# Patient Record
Sex: Female | Born: 1990 | Race: White | Hispanic: No | Marital: Married | Smoking: Never smoker
Health system: Southern US, Community
[De-identification: ages and names within clinical notes are randomized; demographics above are authoritative.]

---

## 2017-03-21 ENCOUNTER — Ambulatory Visit: Payer: Self-pay | Admitting: Obstetrics & Gynecology

## 2017-04-24 ENCOUNTER — Ambulatory Visit: Payer: Self-pay | Admitting: Obstetrics & Gynecology

## 2019-05-06 ENCOUNTER — Other Ambulatory Visit: Payer: Self-pay

## 2019-05-06 ENCOUNTER — Ambulatory Visit (HOSPITAL_COMMUNITY)
Admission: EM | Admit: 2019-05-06 | Discharge: 2019-05-06 | Disposition: A | Discharge status: Home / Self Care | Payer: BLUE CROSS/BLUE SHIELD | Attending: Family Medicine | Admitting: Family Medicine

## 2019-05-06 ENCOUNTER — Encounter (HOSPITAL_COMMUNITY): Payer: Self-pay | Admitting: Emergency Medicine

## 2019-05-06 DIAGNOSIS — S40861A Insect bite (nonvenomous) of right upper arm, initial encounter: Secondary | ICD-10-CM

## 2019-05-06 DIAGNOSIS — W57XXXA Bitten or stung by nonvenomous insect and other nonvenomous arthropods, initial encounter: Secondary | ICD-10-CM

## 2019-05-06 MED ORDER — DOXYCYCLINE HYCLATE 100 MG PO CAPS: 100.0000 mg | ORAL_CAPSULE | Freq: Two times a day (BID) | ORAL | 0 refills | Status: AC

## 2019-05-06 MED ORDER — HYDROCORTISONE 1 % EX CREA: TOPICAL_CREAM | CUTANEOUS | 0 refills | Status: AC

## 2019-05-06 NOTE — Discharge Instructions (Signed)
Try the hydrocortisone cream on the bites for the next couple days.  If the redness or swelling worsens or you develop fevers, chills or joint aches go ahead and start the antibiotics. For severely worsening symptoms you need to follow-up in person or go to the ER

## 2019-05-06 NOTE — ED Triage Notes (Signed)
Pt here for insect bites to arms that are red and painful

## 2019-05-06 NOTE — ED Provider Notes (Signed)
[facility]    CSN: [id number] Arrival date & time: 05/06/19  1053      History   Chief Complaint Chief Complaint  Patient presents with  . Insect Bite    HPI Stephanie Mueller is a 28 y.o. female.   Pt is a 28 year old female that presents with insect bites to right upper arm.  This is been present for the past couple days.  There is 2 total bites.  One bite appears to be healing and does not cause any discomfort.  The second bite is painful, more red with knot.  The bites have been pruritic.  She has not done anything to treat the insect bites.  Denies any associated fever, chills, joint aches, nausea or vomiting.  Unsure of insect.  ROS per HPI      History reviewed. No pertinent past medical history.  There are no active problems to display for this patient.   History reviewed. No pertinent surgical history.  OB History   No obstetric history on file.      Home Medications    Prior to Admission medications   Medication Sig Start Date End Date Taking? Authorizing Provider  doxycycline (VIBRAMYCIN) 100 MG capsule Take 1 capsule (100 mg total) by mouth 2 (two) times daily. 05/06/19   Loura Halt A, NP  hydrocortisone cream 1 % Apply to affected area 2 times daily 05/06/19   Orvan July, NP    Family History History reviewed. No pertinent family history.  Social History Social History   Tobacco Use  . Smoking status: Never Smoker  . Smokeless tobacco: Never Used  Substance Use Topics  . Alcohol use: Not Currently  . Drug use: Never     Allergies   Patient has no known allergies.   Review of Systems Review of Systems   Physical Exam Triage Vital Signs ED Triage Vitals  Enc Vitals Group     BP 05/06/19 1159 140/90     Pulse Rate 05/06/19 1159 93     Resp 05/06/19 1159 18     Temp 05/06/19 1159 98.1 F (36.7 C)     Temp Source 05/06/19 1159 Oral     SpO2 05/06/19 1159 100 %     Weight --      Height --      Head Circumference  --      Peak Flow --      Pain Score 05/06/19 1200 2     Pain Loc --      Pain Edu? --      Excl. in [place]? --    No data found.  Updated Vital Signs BP 140/90 (BP Location: Right Arm)   Pulse 93   Temp 98.1 F (36.7 C) (Oral)   Resp 18   SpO2 100%   Visual Acuity Right Eye Distance:   Left Eye Distance:   Bilateral Distance:    Right Eye Near:   Left Eye Near:    Bilateral Near:     Physical Exam Vitals signs and nursing note reviewed.  Constitutional:      General: She is not in acute distress.    Appearance: Normal appearance. She is not ill-appearing, toxic-appearing or diaphoretic.  HENT:     Head: Normocephalic and atraumatic.     Nose: Nose normal.     Mouth/Throat:     Pharynx: Oropharynx is clear.  Eyes:     Conjunctiva/sclera: Conjunctivae normal.  Neck:  Musculoskeletal: Normal range of motion.  Pulmonary:     Effort: Pulmonary effort is normal.  Musculoskeletal: Normal range of motion.  Skin:    General: Skin is warm and dry.     Comments: 2 small red, raised areas to right upper arm. 1 healing with scab and the other red, warm and slightly hardened. No streaking. Mildly tender.   Neurological:     Mental Status: She is alert.  Psychiatric:        Mood and Affect: Mood normal.      UC Treatments / Results  Labs (all labs ordered are listed, but only abnormal results are displayed) Labs Reviewed - No data to display  EKG   Radiology No results found.  Procedures Procedures (including critical care time)  Medications Ordered in UC Medications - No data to display  Initial Impression / Assessment and Plan / UC Course  I have reviewed the triage vital signs and the nursing notes.  Pertinent labs & imaging results that were available during my care of the patient were reviewed by me and considered in my medical decision making (see chart for details).     Most likely inflammatory reaction from mosquito bite. We will try  hydrocortisone cream twice a day for the next couple of days. She was worried about spider bites. Does not appear to be this.  If the area gets more red, swelling or painful starts spreading she will need to go ahead and start the antibiotics at that time. For more severe worsening problems to include streaking up the arm she will need to go to the ER Final Clinical Impressions(s) / UC Diagnoses   Final diagnoses:  Insect bite of right upper arm, initial encounter     Discharge Instructions     Try the hydrocortisone cream on the bites for the next couple days.  If the redness or swelling worsens or you develop fevers, chills or joint aches go ahead and start the antibiotics. For severely worsening symptoms you need to follow-up in person or go to the ER   ED Prescriptions    Medication Sig Dispense Auth. Provider   hydrocortisone cream 1 % Apply to affected area 2 times daily 15 g Stefany Starace A, NP   doxycycline (VIBRAMYCIN) 100 MG capsule Take 1 capsule (100 mg total) by mouth 2 (two) times daily. 20 capsule Dahlia ByesBast, Ketina Mars A, NP     Controlled Substance Prescriptions [place] Controlled Substance Registry consulted? Not Applicable   Janace ArisBast, Rosebud Koenen A, NP 05/06/19 1234

## 2019-12-31 ENCOUNTER — Emergency Department (HOSPITAL_COMMUNITY): Payer: BLUE CROSS/BLUE SHIELD

## 2019-12-31 ENCOUNTER — Other Ambulatory Visit: Payer: Self-pay

## 2019-12-31 ENCOUNTER — Emergency Department (HOSPITAL_COMMUNITY)
Admission: EM | Admit: 2019-12-31 | Discharge: 2019-12-31 | Disposition: A | Discharge status: Home / Self Care | Payer: BLUE CROSS/BLUE SHIELD | Attending: Emergency Medicine | Admitting: Emergency Medicine

## 2019-12-31 DIAGNOSIS — Z6841 Body Mass Index (BMI) 40.0 and over, adult: Secondary | ICD-10-CM | POA: Insufficient documentation

## 2019-12-31 DIAGNOSIS — S80812A Abrasion, left lower leg, initial encounter: Secondary | ICD-10-CM | POA: Insufficient documentation

## 2019-12-31 DIAGNOSIS — S43004A Unspecified dislocation of right shoulder joint, initial encounter: Secondary | ICD-10-CM

## 2019-12-31 DIAGNOSIS — Y939 Activity, unspecified: Secondary | ICD-10-CM | POA: Insufficient documentation

## 2019-12-31 DIAGNOSIS — Y929 Unspecified place or not applicable: Secondary | ICD-10-CM | POA: Insufficient documentation

## 2019-12-31 DIAGNOSIS — E669 Obesity, unspecified: Secondary | ICD-10-CM | POA: Diagnosis not present

## 2019-12-31 DIAGNOSIS — W109XXA Fall (on) (from) unspecified stairs and steps, initial encounter: Secondary | ICD-10-CM | POA: Insufficient documentation

## 2019-12-31 DIAGNOSIS — M25511 Pain in right shoulder: Secondary | ICD-10-CM | POA: Diagnosis present

## 2019-12-31 DIAGNOSIS — S80811A Abrasion, right lower leg, initial encounter: Secondary | ICD-10-CM | POA: Diagnosis not present

## 2019-12-31 DIAGNOSIS — S43001A Unspecified subluxation of right shoulder joint, initial encounter: Secondary | ICD-10-CM | POA: Diagnosis not present

## 2019-12-31 MED ORDER — MORPHINE SULFATE (PF) 4 MG/ML IV SOLN
4.0000 mg | Freq: Once | INTRAVENOUS | Status: AC
  Administered 2019-12-31: 4 mg via INTRAVENOUS
  Filled 2019-12-31: qty 1

## 2019-12-31 MED ORDER — PROPOFOL 10 MG/ML IV BOLUS
0.5000 mg/kg | Freq: Once | INTRAVENOUS | Status: AC
  Administered 2019-12-31: 12:00:00 80 mg via INTRAVENOUS
  Filled 2019-12-31: qty 20

## 2019-12-31 MED ORDER — ONDANSETRON HCL 4 MG/2ML IJ SOLN
4.0000 mg | Freq: Once | INTRAMUSCULAR | Status: AC
  Administered 2019-12-31: 11:00:00 4 mg via INTRAVENOUS
  Filled 2019-12-31: qty 2

## 2019-12-31 NOTE — ED Provider Notes (Signed)
[facility] EMERGENCY DEPARTMENT Provider Note   CSN: [id number] Arrival date & time: 12/31/19  1005     History Chief Complaint  Patient presents with  . Dislocation    Collen Jablon is a 29 y.o. female.  Patient is a 30 year old female presenting today with right shoulder pain.  Patient reports that she was going up the stairs and tripped and fell hitting her right shoulder on the step.  She got some scratches on her legs but denies any headache, head injury or loss of consciousness.  Since that time her shoulder has been in 10 out of 10 pain and she is unable to move it.  Patient has a significant history of recurrent right shoulder dislocation.  When she was 29 years old she had surgery because of recurrent dislocations and reports she last dislocated around 5 to 6 years ago.  She denies any numbness or tingling in the hand.  No other complaints at this time.  The history is provided by the patient.       No past medical history on file.  There are no problems to display for this patient.   No past surgical history on file.   OB History   No obstetric history on file.     No family history on file.  Social History   Tobacco Use  . Smoking status: Never Smoker  . Smokeless tobacco: Never Used  Substance Use Topics  . Alcohol use: Not Currently  . Drug use: Never    Home Medications Prior to Admission medications   Medication Sig Start Date End Date Taking? Authorizing Provider  doxycycline (VIBRAMYCIN) 100 MG capsule Take 1 capsule (100 mg total) by mouth 2 (two) times daily. 05/06/19   Loura Halt A, NP  hydrocortisone cream 1 % Apply to affected area 2 times daily 05/06/19   Loura Halt A, NP    Allergies    Patient has no known allergies.  Review of Systems   Review of Systems  All other systems reviewed and are negative.   Physical Exam Updated Vital Signs BP (!) 127/97   Pulse 88   Temp (!) 97.4 F (36.3 C) (Oral)   Resp 16    Ht 5\' 9"  (1.753 m)   Wt (!) 163.3 kg   LMP 11/02/2019 (Approximate)   SpO2 96%   BMI 53.16 kg/m   Physical Exam Vitals and nursing note reviewed.  Constitutional:      General: She is not in acute distress.    Appearance: Normal appearance. She is obese.  HENT:     Head: Normocephalic.     Mouth/Throat:     Mouth: Mucous membranes are moist.  Eyes:     Pupils: Pupils are equal, round, and reactive to light.  Cardiovascular:     Rate and Rhythm: Normal rate.     Pulses: Normal pulses.  Pulmonary:     Effort: Pulmonary effort is normal.  Musculoskeletal:        General: Deformity present.     Right shoulder: Deformity, tenderness and bony tenderness present. Decreased range of motion. Normal strength. Normal pulse.  Skin:    General: Skin is warm.  Neurological:     General: No focal deficit present.     Mental Status: She is alert and oriented to person, place, and time. Mental status is at baseline.  Psychiatric:        Mood and Affect: Mood normal.  Behavior: Behavior normal.     ED Results / Procedures / Treatments   Labs (all labs ordered are listed, but only abnormal results are displayed) Labs Reviewed - No data to display  EKG None  Radiology DG Shoulder Right  Result Date: 12/31/2019 CLINICAL DATA:  Larey Seat downstairs, right shoulder dislocation EXAM: RIGHT SHOULDER - 2+ VIEW COMPARISON:  12/10/2012 FINDINGS: Frontal and transscapular views of the right shoulder demonstrate anterior glenohumeral dislocation. No evidence of displaced fracture. Surgical anchor within the glenoid unchanged. IMPRESSION: 1. Anterior glenohumeral dislocation. Electronically Signed   By: Sharlet Salina M.D.   On: 12/31/2019 10:47   DG Shoulder Right Portable  Result Date: 12/31/2019 CLINICAL DATA:  Reduction of right shoulder dislocation EXAM: PORTABLE RIGHT SHOULDER COMPARISON:  12/31/2019 FINDINGS: Frontal and transscapular views of the right shoulder are obtained.  Evaluation is limited by portable technique and body habitus. There has been reduction of the anterior glenohumeral dislocation seen previously. No displaced fracture. IMPRESSION: 1. Anatomic alignment of the glenohumeral joint. Electronically Signed   By: Sharlet Salina M.D.   On: 12/31/2019 12:17    Procedures .Sedation  Date/Time: 12/31/2019 11:59 AM Performed by: Gwyneth Sprout, MD Authorized by: Gwyneth Sprout, MD   Consent:    Consent obtained:  Verbal   Consent given by:  Patient   Risks discussed:  Allergic reaction, dysrhythmia, inadequate sedation, nausea, prolonged hypoxia resulting in organ damage, prolonged sedation necessitating reversal, respiratory compromise necessitating ventilatory assistance and intubation and vomiting   Alternatives discussed:  Analgesia without sedation, anxiolysis and regional anesthesia Universal protocol:    Procedure explained and questions answered to patient or proxy's satisfaction: yes     Relevant documents present and verified: yes     Test results available and properly labeled: yes     Imaging studies available: yes     Required blood products, implants, devices, and special equipment available: yes     Site/side marked: yes     Immediately prior to procedure a time out was called: yes     Patient identity confirmation method:  Verbally with patient Indications:    Procedure performed:  Dislocation reduction   Procedure necessitating sedation performed by:  Physician performing sedation Pre-sedation assessment:    Time since last food or drink:  2 hours ago   ASA classification: class 2 - patient with mild systemic disease     Neck mobility: normal     Mouth opening:  2 finger widths   Thyromental distance:  2 finger widths   Mallampati score:  I - soft palate, uvula, fauces, pillars visible   Pre-sedation assessments completed and reviewed: airway patency, cardiovascular function, hydration status, mental status,  nausea/vomiting, pain level, respiratory function and temperature     Pre-sedation assessment completed:  12/31/2019 11:40 AM Immediate pre-procedure details:    Reassessment: Patient reassessed immediately prior to procedure     Reviewed: vital signs, relevant labs/tests and NPO status     Verified: bag valve mask available, emergency equipment available, intubation equipment available, IV patency confirmed, oxygen available and suction available   Procedure details (see MAR for exact dosages):    Preoxygenation:  Nasal cannula   Sedation:  Propofol   Intended level of sedation: deep   Analgesia:  Morphine   Intra-procedure monitoring:  Blood pressure monitoring, cardiac monitor, continuous pulse oximetry, frequent LOC assessments, frequent vital sign checks and continuous capnometry   Intra-procedure events: none     Total Provider sedation time (minutes):  10 Post-procedure details:  Post-sedation assessment completed:  12/31/2019 12:00 PM   Attendance: Constant attendance by certified staff until patient recovered     Recovery: Patient returned to pre-procedure baseline     Post-sedation assessments completed and reviewed: airway patency, cardiovascular function, hydration status, mental status, nausea/vomiting, pain level, respiratory function and temperature     Patient is stable for discharge or admission: yes     Patient tolerance:  Tolerated well, no immediate complications Reduction of dislocation  Date/Time: 12/31/2019 12:00 PM Performed by: Gwyneth Sprout, MD Authorized by: Gwyneth Sprout, MD  Consent: Verbal consent obtained. Consent given by: patient Patient understanding: patient states understanding of the procedure being performed Patient consent: the patient's understanding of the procedure matches consent given Relevant documents: relevant documents present and verified Imaging studies: imaging studies available Patient identity confirmed: verbally with  patient Time out: Immediately prior to procedure a "time out" was called to verify the correct patient, procedure, equipment, support staff and site/side marked as required. Local anesthesia used: no  Anesthesia: Local anesthesia used: no  Sedation: Patient sedated: yes Sedation type: moderate (conscious) sedation Sedatives: propofol Analgesia: morphine  Patient tolerance: patient tolerated the procedure well with no immediate complications Comments: With shoulder abduction and rotation, audible click and return to normal alignment visualized.  Normal sensation in fingers and hand grip normal.    (including critical care time)  Medications Ordered in ED Medications  morphine 4 MG/ML injection 4 mg (has no administration in time range)  ondansetron (ZOFRAN) injection 4 mg (has no administration in time range)  propofol (DIPRIVAN) 10 mg/mL bolus/IV push 81.7 mg (has no administration in time range)    ED Course  I have reviewed the triage vital signs and the nursing notes.  Pertinent labs & imaging results that were available during my care of the patient were reviewed by me and considered in my medical decision making (see chart for details).    MDM Rules/Calculators/A&P                      29 year old female presenting today with a shoulder dislocation after falling.  She has significant pain in her arm but x-ray shows anterior glenohumeral dislocation without evidence of fracture.  She has no other complaints of pain or injury.  Currently neurovascularly intact.  She does have some mild abrasions on her legs but is able to walk without difficulty.  Low suspicion for head injury at this time.  Patient given pain control.  Will sedate for reduction.  12:44 PM No complications with sedation.  Shoulder reduced and reduction films wnl.  Given f/u for ortho and pt d/ced home.  Final Clinical Impression(s) / ED Diagnoses Final diagnoses:  Dislocation of right shoulder joint,  initial encounter    Rx / DC Orders ED Discharge Orders    None       Gwyneth Sprout, MD 12/31/19 1247

## 2019-12-31 NOTE — Progress Notes (Signed)
Orthopedic Tech Progress Note Patient Details:  Stephanie Mueller 1991-07-17 [id number] MD did a shoulder reduction and I applied a shoulder immobilizer  Ortho Devices Type of Ortho Device: Shoulder immobilizer Ortho Device/Splint Location: RUE Ortho Device/Splint Interventions: Ordered, Application   Post Interventions Patient Tolerated: Well Instructions Provided: Care of device, Adjustment of device   Donald Pore 12/31/2019, 12:20 PM

## 2019-12-31 NOTE — Discharge Instructions (Signed)
Wear the sling for about 1 week and start ranging the shoulder to prevent it from getting stiff.  He can take Tylenol or ibuprofen as needed for pain control.

## 2019-12-31 NOTE — ED Triage Notes (Signed)
Pt here after falling down a few stairs and hitting her R shoulder on the wall, dislocating it. Hx of same. Abrasions on leg from fall, no other injury, did not pass out, not dizziness prior to fall. Ambulatory.

## 2019-12-31 NOTE — ED Notes (Signed)
Patient verbalizes understanding of discharge instructions . Opportunity for questions and answers were provided . Armband removed by staff ,Pt discharged from ED. W/C  offered at D/C  and Declined W/C at D/C and was escorted to lobby by RN.  

## 2020-08-21 IMAGING — DX DG SHOULDER 2+V*R*
2 series · 3 of 3 positions shown · non-contrast
Comparison: 12/10/2012

CLINICAL DATA: Fell downstairs, right shoulder dislocation

EXAM:
RIGHT SHOULDER - 2+ VIEW

[Series 1: shoulder grashey · 0.14mm/px · 2 of 2 slices shown]
[im 1/2]
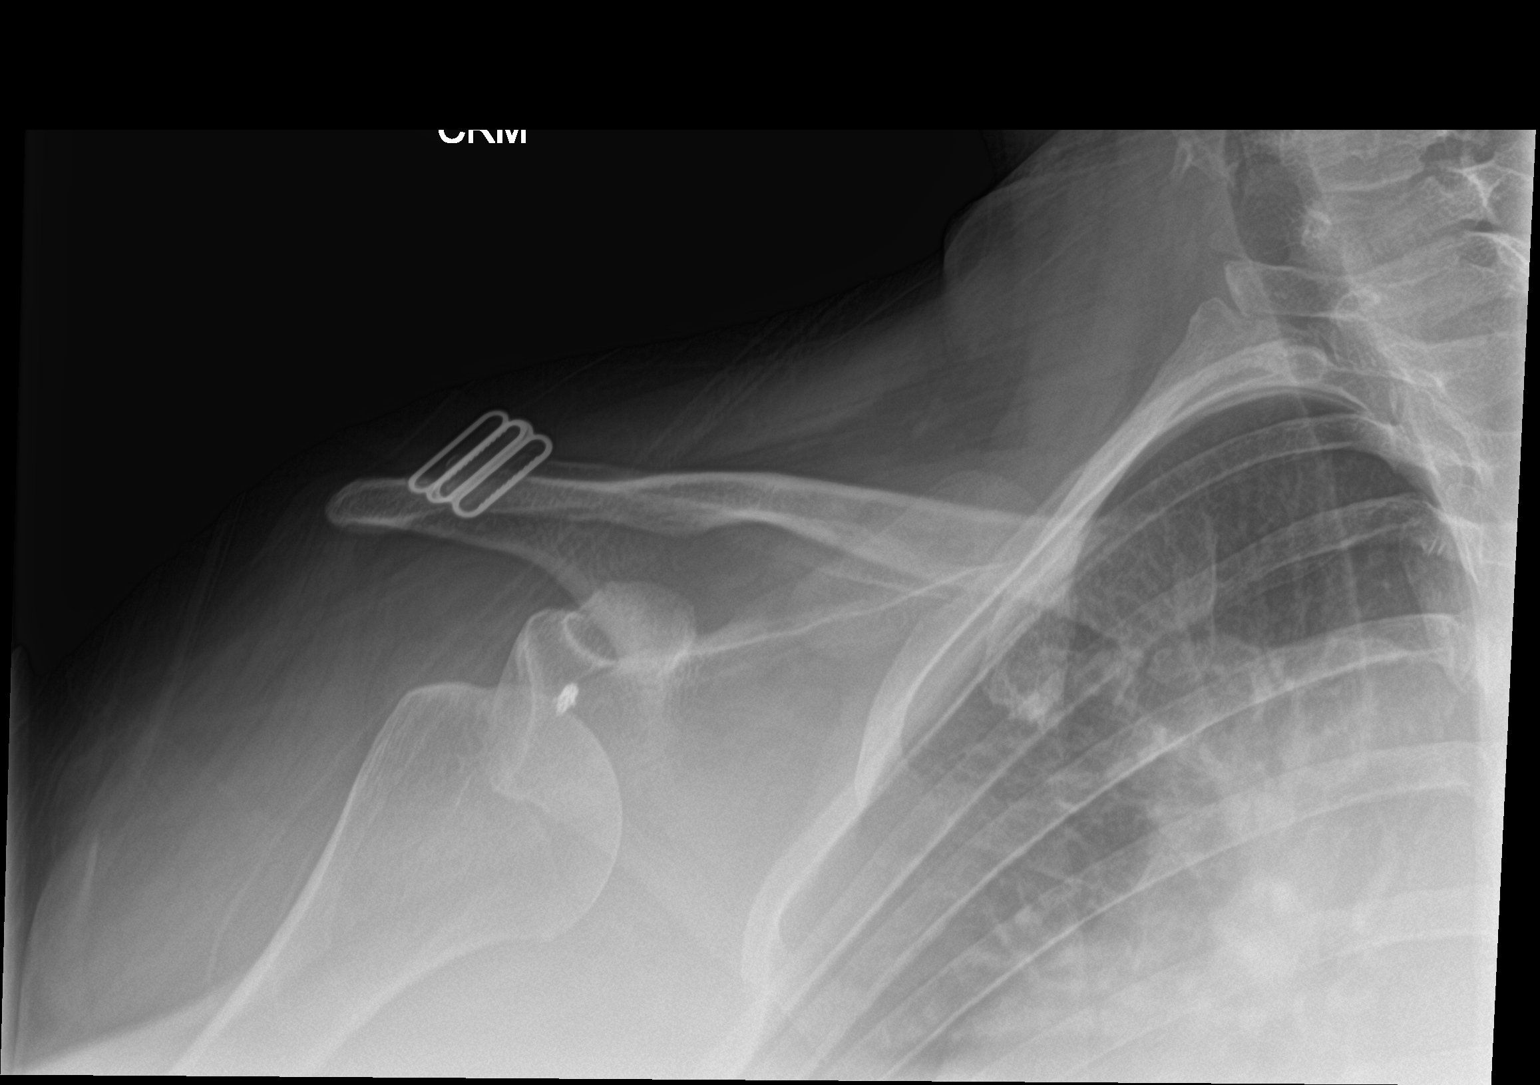
[im 2/2]
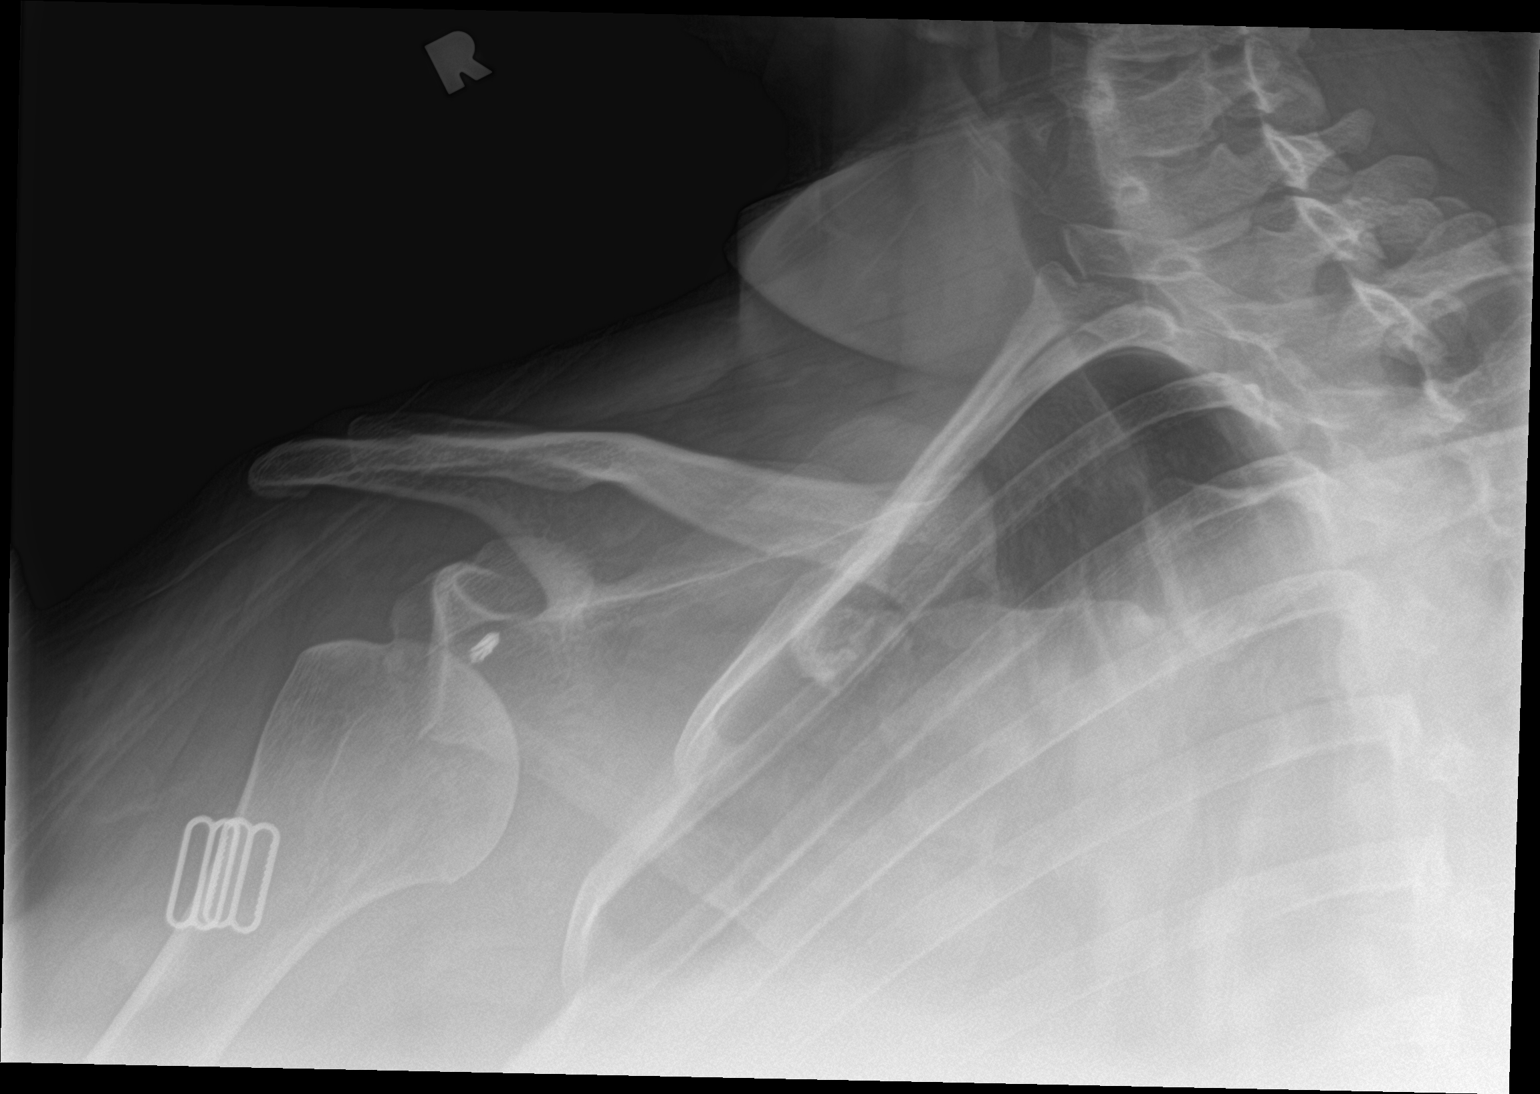

[shoulder y view]
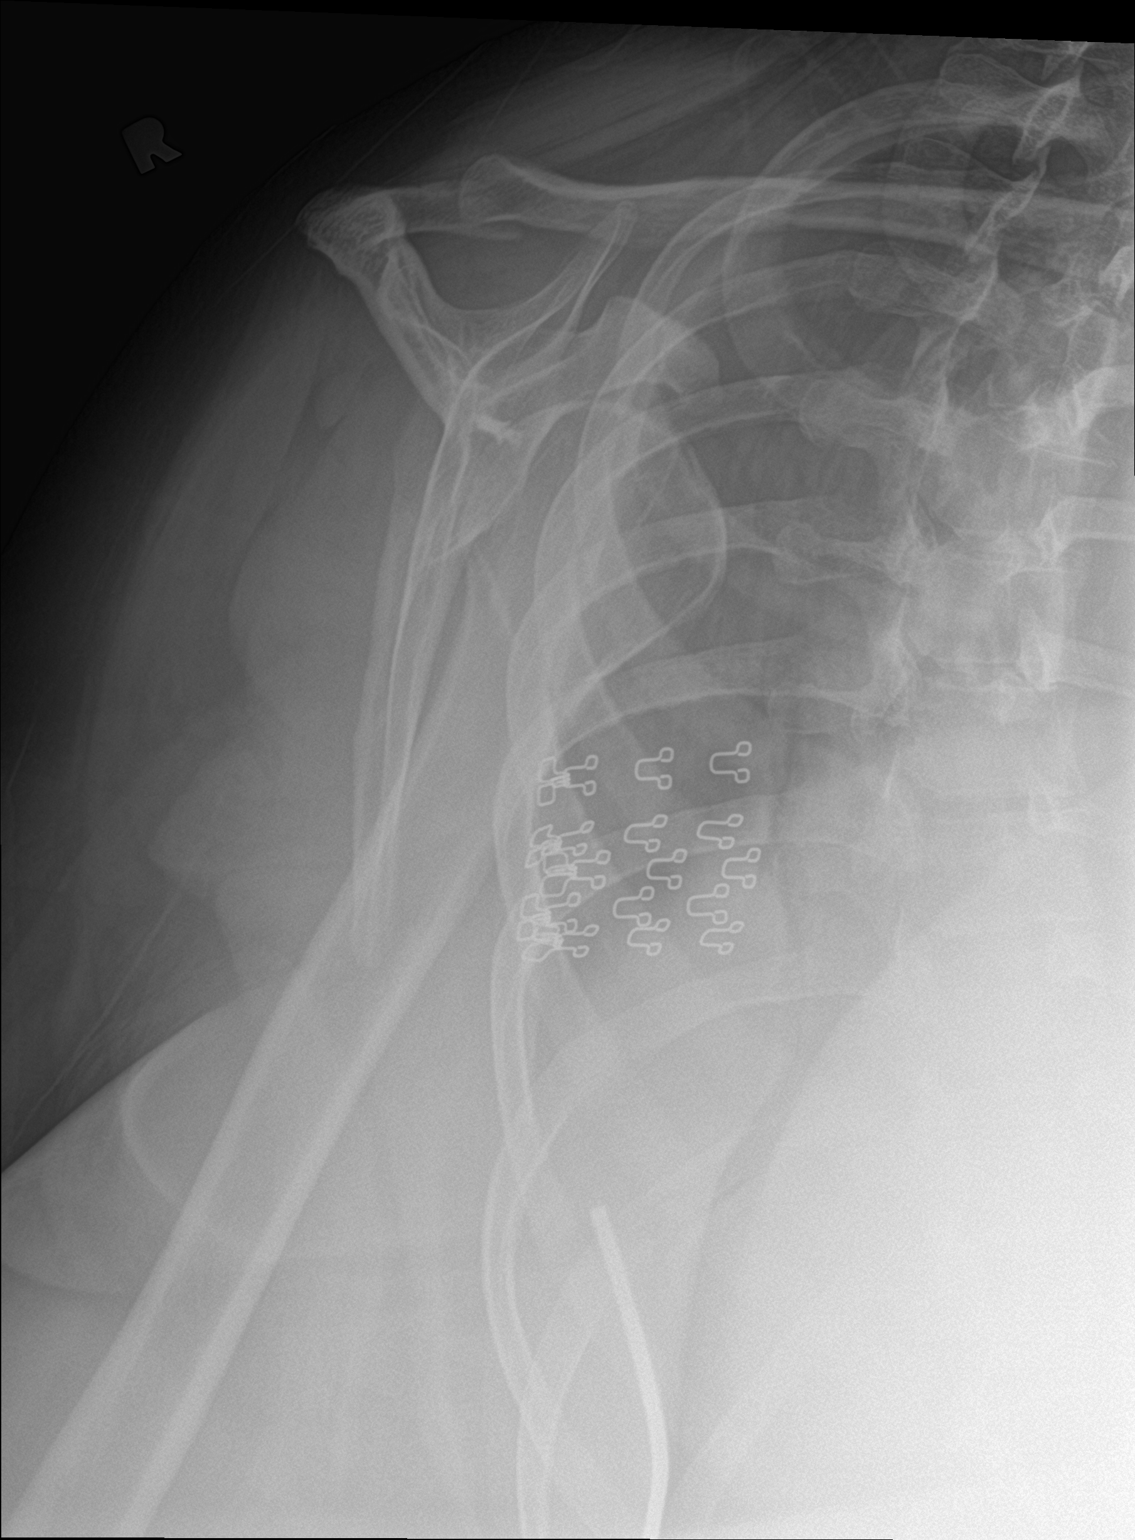

[3 of 3 positions shown; findings below may reference images not displayed]

FINDINGS: Frontal and transscapular views of the right shoulder demonstrate
anterior glenohumeral dislocation. No evidence of displaced
fracture. Surgical anchor within the glenoid unchanged.
IMPRESSION: 1. Anterior glenohumeral dislocation.
